# Patient Record
Sex: Male | Born: 1975 | Race: White | Hispanic: No | Marital: Married | Smoking: Former smoker
Health system: Southern US, Community
[De-identification: ages and names within clinical notes are randomized; demographics above are authoritative.]

## PROBLEM LIST (undated history)

## (undated) HISTORY — PX: INNER EAR SURGERY: SHX679

---

## 2010-01-20 ENCOUNTER — Emergency Department (HOSPITAL_BASED_OUTPATIENT_CLINIC_OR_DEPARTMENT_OTHER): Admission: EM | Admit: 2010-01-20 | Discharge: 2010-01-21 | Payer: Self-pay | Admitting: Emergency Medicine

## 2010-01-20 ENCOUNTER — Ambulatory Visit: Payer: Self-pay | Admitting: Diagnostic Radiology

## 2010-08-23 ENCOUNTER — Emergency Department (HOSPITAL_BASED_OUTPATIENT_CLINIC_OR_DEPARTMENT_OTHER): Admission: EM | Admit: 2010-08-23 | Discharge: 2010-08-23 | Payer: Self-pay | Admitting: Emergency Medicine

## 2011-11-25 ENCOUNTER — Emergency Department (HOSPITAL_BASED_OUTPATIENT_CLINIC_OR_DEPARTMENT_OTHER)
Admission: EM | Admit: 2011-11-25 | Discharge: 2011-11-25 | Disposition: A | Discharge status: Home / Self Care | Payer: Self-pay | Attending: Emergency Medicine | Admitting: Emergency Medicine

## 2011-11-25 ENCOUNTER — Other Ambulatory Visit: Payer: Self-pay

## 2011-11-25 ENCOUNTER — Encounter (HOSPITAL_BASED_OUTPATIENT_CLINIC_OR_DEPARTMENT_OTHER): Payer: Self-pay | Admitting: Emergency Medicine

## 2011-11-25 DIAGNOSIS — R61 Generalized hyperhidrosis: Secondary | ICD-10-CM | POA: Insufficient documentation

## 2011-11-25 DIAGNOSIS — R112 Nausea with vomiting, unspecified: Secondary | ICD-10-CM | POA: Insufficient documentation

## 2011-11-25 DIAGNOSIS — R109 Unspecified abdominal pain: Secondary | ICD-10-CM | POA: Insufficient documentation

## 2011-11-25 DIAGNOSIS — K219 Gastro-esophageal reflux disease without esophagitis: Secondary | ICD-10-CM | POA: Insufficient documentation

## 2011-11-25 LAB — CBC
HCT: 43.8 % (ref 39.0–52.0)
Hemoglobin: 15.1 g/dL (ref 13.0–17.0)
MCH: 30.1 pg (ref 26.0–34.0)
MCV: 87.4 fL (ref 78.0–100.0)
Platelets: 328 10*3/uL (ref 150–400)
RBC: 5.01 MIL/uL (ref 4.22–5.81)
RDW: 12.8 % (ref 11.5–15.5)
WBC: 13.8 10*3/uL — ABNORMAL HIGH (ref 4.0–10.5)

## 2011-11-25 LAB — LIPASE, BLOOD: Lipase: 21 U/L (ref 11–59)

## 2011-11-25 LAB — COMPREHENSIVE METABOLIC PANEL
ALT: 19 U/L (ref 0–53)
AST: 15 U/L (ref 0–37)
Alkaline Phosphatase: 49 U/L (ref 39–117)
BUN: 11 mg/dL (ref 6–23)
Chloride: 100 mEq/L (ref 96–112)
GFR calc non Af Amer: >90 mL/min (ref >90)
Sodium: 142 mEq/L (ref 135–145)
Total Bilirubin: 0.4 mg/dL (ref 0.3–1.2)
Total Protein: 7.5 g/dL (ref 6.0–8.3)

## 2011-11-25 LAB — DIFFERENTIAL
Eosinophils Relative: 1 % (ref 0–5)
Lymphs Abs: 2.8 10*3/uL (ref 0.7–4.0)

## 2011-11-25 MED ORDER — FAMOTIDINE IN NACL 20-0.9 MG/50ML-% IV SOLN
20.0000 mg | Freq: Once | INTRAVENOUS | Status: AC
  Administered 2011-11-25: 20 mg via INTRAVENOUS
  Filled 2011-11-25: qty 50

## 2011-11-25 MED ORDER — SODIUM CHLORIDE 0.9 % IV BOLUS (SEPSIS)
1000.0000 mL | Freq: Once | INTRAVENOUS | Status: AC
  Administered 2011-11-25: 1000 mL via INTRAVENOUS

## 2011-11-25 MED ORDER — GI COCKTAIL ~~LOC~~
30.0000 mL | Freq: Once | ORAL | Status: AC
  Administered 2011-11-25: 30 mL via ORAL
  Filled 2011-11-25: qty 30
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
## 2011-11-25 NOTE — Discharge Instructions (Signed)
As we discussed, it is very important that you begin taking your omeprazole again.  He may take any of the over-the-counter equivalents, such as lansoprazole, or pantoprazole.  Take this medication as directed for the next 2 weeks.  You should also purchase Maalox to have for acute exacerbations.  Additionally, please minimize known precipitants of your condition.  If you develop any new, or concerning changes in her condition, such as difficulty breathing, chest pain, consciousness, different abdominal pain, please return to emergency department for further evaluation.  Please use the provided number when possible, to arrange gastroenterology followup.

## 2011-11-25 NOTE — ED Notes (Signed)
Pt c/o vomiting since this am; feels like heartburn now in epigastric area; has been able to tolerate po liquids

## 2014-05-19 ENCOUNTER — Encounter (HOSPITAL_BASED_OUTPATIENT_CLINIC_OR_DEPARTMENT_OTHER): Payer: Self-pay | Admitting: Emergency Medicine

## 2014-05-19 ENCOUNTER — Emergency Department (HOSPITAL_BASED_OUTPATIENT_CLINIC_OR_DEPARTMENT_OTHER)
Admission: EM | Admit: 2014-05-19 | Discharge: 2014-05-19 | Disposition: A | Discharge status: Home / Self Care | Payer: Medicaid Other | Attending: Emergency Medicine | Admitting: Emergency Medicine

## 2014-05-19 DIAGNOSIS — T63461A Toxic effect of venom of wasps, accidental (unintentional), initial encounter: Secondary | ICD-10-CM | POA: Insufficient documentation

## 2014-05-19 DIAGNOSIS — Z79899 Other long term (current) drug therapy: Secondary | ICD-10-CM | POA: Insufficient documentation

## 2014-05-19 DIAGNOSIS — T63441A Toxic effect of venom of bees, accidental (unintentional), initial encounter: Secondary | ICD-10-CM

## 2014-05-19 DIAGNOSIS — Y929 Unspecified place or not applicable: Secondary | ICD-10-CM | POA: Insufficient documentation

## 2014-05-19 DIAGNOSIS — Z87891 Personal history of nicotine dependence: Secondary | ICD-10-CM | POA: Insufficient documentation

## 2014-05-19 DIAGNOSIS — T6391XA Toxic effect of contact with unspecified venomous animal, accidental (unintentional), initial encounter: Secondary | ICD-10-CM | POA: Insufficient documentation

## 2014-05-19 DIAGNOSIS — Y939 Activity, unspecified: Secondary | ICD-10-CM | POA: Insufficient documentation

## 2014-05-19 MED ORDER — IBUPROFEN 400 MG PO TABS
600.0000 mg | ORAL_TABLET | Freq: Once | ORAL | Status: AC
  Administered 2014-05-19: 600 mg via ORAL
  Filled 2014-05-19 (×2): qty 1

## 2014-05-19 NOTE — Discharge Instructions (Signed)

## 2014-05-19 NOTE — ED Notes (Signed)
Pt given ibuprofen per request prior to leaving.

## 2014-05-19 NOTE — ED Notes (Signed)
Bee sting to left ear PTA.

## 2014-05-19 NOTE — ED Notes (Signed)
Pt asking for ibuprofen for pain prior to leaving.

## 2014-05-28 NOTE — ED Provider Notes (Signed)
CSN: [id number]     Arrival date & time 05/19/14  1135 History   First MD Initiated Contact with Patient 05/19/14 1201     Chief Complaint  Patient presents with  . Insect Bite   HPI Mr. Rabine is a 38 yo man who experienced 3 bee stings while in his car about 5 minutes prior to presentation to the ED. One is on his right lower extremity, one is on his right back and one is on his outer left ear. He states that he has been stung by bees in the past without any problems, but was concerned initially today because one of the stings was on his ear; he was worried that this area might be more tender than the sites of other bee stings.   History reviewed. No pertinent past medical history. Past Surgical History  Procedure Laterality Date  . Inner ear surgery     No family history on file. History  Substance Use Topics  . Smoking status: Former Games developer  . Smokeless tobacco: Not on file  . Alcohol Use: No    Review of Systems  All other systems reviewed and are negative.  General: has been well, no recent illness Skin: no rashes or lesions other than those mentioned in HPI HEENT: no headaches, no changes in vision or hearing, bee sting on left outer ear Cardiac: no chest pain, no palpitations Respiratory: no shortness of breath, no wheezes GI: no abdominal pain, no changes in BMs Urinary: no changes in urination Msk: no muscle pain or joint tenderness Psychiatric: no history of depression or anxiety   Allergies  Review of patient's allergies indicates no known allergies.  Home Medications   Prior to Admission medications   Medication Sig Start Date End Date Taking? Authorizing Provider  omeprazole (PRILOSEC) 20 MG capsule Take 20 mg by mouth daily.   Yes Historical Provider, MD  acetaminophen (TYLENOL) 500 MG tablet Take 1,000 mg by mouth every 6 (six) hours as needed. For pain    Historical Provider, MD  CHLOROPHYLL PO Take 30 mLs by mouth daily.    Historical Provider, MD   BP  142/96  Pulse 80  Temp(Src) 97.9 F (36.6 C) (Oral)  Ht  (1.88 m)  Wt 213 lb (96.616 kg)  BMI 27.34 kg/m2  SpO2 99% Physical Exam Appearance: in NAD, apologizing for coming to the ED HEENT: small area of erythema at inner fold of outer left ear; no stinger present, no swelling, otherwise AT/[place], PERRL, EOMi, hearing intact and symmetric, tongue of normal size Heart: RRR, normal S1S2 Lungs: normal effort, CTAB, no wheezing Abdomen: thin, +BS, soft, nontender Musculoskeletal: no tenderness to palpation Extremities: 2" diameter round raised area of erythema and tenderness around presumed bee sting on right lower back, 1" diameter round raised area of erythema and tenderness around presumed bee sting on right lower extremity, above knee Neurologic: A&Ox3   ED Course  Procedures (including critical care time) Labs Review Labs Reviewed - No data to display  Imaging Review No results found.   EKG Interpretation None      MDM   Final diagnoses:  Bee sting, accidental or unintentional, initial encounter    Mr. Isa is a 38 yo man who had 3 bee stings while dirivng his car just prior to arrival and was concerned about one sting that was on his outer ear; he had no impressive swelling, worrisome erythema, shortness of breath or any other signs of anaphylaxis, and was discharged home  with advice to manage his pain with NSAIDs.      Dionne Ano, MD 05/28/14 [phone]

## 2014-06-02 NOTE — ED Provider Notes (Signed)
I saw and evaluated the patient, reviewed the resident's note and I agree with the findings and plan.   .Face to face Exam:  General:  Awake HEENT:  Atraumatic Resp:  Normal effort Abd:  Nondistended Neuro:No focal weakness  Nelia Shi, MD 06/02/14 2214

## 2014-12-02 ENCOUNTER — Emergency Department (HOSPITAL_BASED_OUTPATIENT_CLINIC_OR_DEPARTMENT_OTHER)
Admission: EM | Admit: 2014-12-02 | Discharge: 2014-12-02 | Disposition: A | Discharge status: Home / Self Care | Payer: Medicaid Other | Attending: Emergency Medicine | Admitting: Emergency Medicine

## 2014-12-02 ENCOUNTER — Emergency Department (HOSPITAL_BASED_OUTPATIENT_CLINIC_OR_DEPARTMENT_OTHER): Payer: Medicaid Other

## 2014-12-02 ENCOUNTER — Encounter (HOSPITAL_BASED_OUTPATIENT_CLINIC_OR_DEPARTMENT_OTHER): Payer: Self-pay | Admitting: *Deleted

## 2014-12-02 DIAGNOSIS — Z87891 Personal history of nicotine dependence: Secondary | ICD-10-CM | POA: Insufficient documentation

## 2014-12-02 DIAGNOSIS — Z79899 Other long term (current) drug therapy: Secondary | ICD-10-CM | POA: Diagnosis not present

## 2014-12-02 DIAGNOSIS — S61011A Laceration without foreign body of right thumb without damage to nail, initial encounter: Secondary | ICD-10-CM

## 2014-12-02 DIAGNOSIS — Y9389 Activity, other specified: Secondary | ICD-10-CM | POA: Insufficient documentation

## 2014-12-02 DIAGNOSIS — Y998 Other external cause status: Secondary | ICD-10-CM | POA: Diagnosis not present

## 2014-12-02 DIAGNOSIS — Y9289 Other specified places as the place of occurrence of the external cause: Secondary | ICD-10-CM | POA: Diagnosis not present

## 2014-12-02 DIAGNOSIS — W312XXA Contact with powered woodworking and forming machines, initial encounter: Secondary | ICD-10-CM | POA: Diagnosis not present

## 2014-12-02 DIAGNOSIS — S65401A Unspecified injury of blood vessel of right thumb, initial encounter: Secondary | ICD-10-CM | POA: Diagnosis present

## 2014-12-02 MED ORDER — OXYCODONE-ACETAMINOPHEN 5-325 MG PO TABS: 1.0000 | ORAL_TABLET | ORAL | Status: AC | PRN

## 2014-12-02 NOTE — ED Provider Notes (Signed)
CSN: [id number]     Arrival date & time 12/02/14  1926 History  This chart was scribed for Audree CamelScott T Remon Quinto, MD by Luisa DagoPriscilla Tutu, ED Scribe. This patient was seen in room MH10/MH10 and the patient's care was started at 9:21 PM.     Chief Complaint  Patient presents with  . Finger Injury   The history is provided by the patient and medical records. No language interpreter was used.   HPI Comments: Harry Miller is a 39 y.o. male who presents to the Emergency Department complaining of laceration to his right thumb that occurred approximately 2.5 hours ago. Pt states that he was using a table saw when it slipped and accidentally cut his right thumb. Bleeding is controlled. He reports some numbness to the tip of his right thumb. Pt's last tetanus vaccine was in 2012. No fever, or weakness.  History reviewed. No pertinent past medical history. Past Surgical History  Procedure Laterality Date  . Inner ear surgery     No family history on file. History  Substance Use Topics  . Smoking status: Former Games developermoker  . Smokeless tobacco: Not on file  . Alcohol Use: No    Review of Systems  Constitutional: Negative for fever.  Musculoskeletal: Positive for arthralgias. Negative for myalgias.  Skin: Positive for wound.  Neurological: Positive for numbness. Negative for weakness.  All other systems reviewed and are negative.  Allergies  Review of patient's allergies indicates no known allergies.  Home Medications   Prior to Admission medications   Medication Sig Start Date End Date Taking? Authorizing Provider  omeprazole (PRILOSEC) 20 MG capsule Take 20 mg by mouth daily.   Yes Historical Provider, MD  acetaminophen (TYLENOL) 500 MG tablet Take 1,000 mg by mouth every 6 (six) hours as needed. For pain    Historical Provider, MD  CHLOROPHYLL PO Take 30 mLs by mouth daily.    Historical Provider, MD   BP 151/96 mmHg  Pulse 99  Temp(Src) 98.5 F (36.9 C) (Oral)  Resp 18  Ht 6\' 2"  (1.88 m)   Wt 220 lb (99.791 kg)  BMI 28.23 kg/m2  SpO2 98%  Physical Exam  Constitutional: He is oriented to person, place, and time. He appears well-developed and well-nourished. No distress.  HENT:  Head: Normocephalic and atraumatic.  Cardiovascular: Intact distal pulses.   Pulses:      Radial pulses are 2+ on the right side.  Pulmonary/Chest: Effort normal. No respiratory distress.  Musculoskeletal:       Right hand: He exhibits tenderness and laceration. He exhibits normal capillary refill.  Neurological: He is alert and oriented to person, place, and time.  Skin: Skin is warm and dry. No erythema. No pallor.  Psychiatric: He has a normal mood and affect. His behavior is normal.  Nursing note and vitals reviewed.  Small chunk of skin avulsed just beside nail. Nail intact without tenderness, lac, or hematoma. Small puncture wound away from nail but otherwise there is a superficial skin injury without deep laceration. Focal numbness over the injury but otherwise neurologically intact.   ED Course  Procedures (including critical care time)  DIAGNOSTIC STUDIES: Oxygen Saturation is 98% on RA, normal by my interpretation.    COORDINATION OF CARE: 9:25 PM- Pt advised of plan for treatment and pt agrees.  Imaging Review Dg Finger Thumb Right  12/02/2014   CLINICAL DATA:  Recent laceration with severe hemorrhage  EXAM: RIGHT THUMB 2+V  COMPARISON:  None.  FINDINGS: Soft tissue injury  is noted consistent with the given clinical history. No acute bony abnormality is seen. No radiopaque foreign body is noted.  IMPRESSION: Soft tissue injury without acute bony abnormality.   Electronically Signed   By: Alcide Clever M.D.   On: 12/02/2014 20:03    MDM   Final diagnoses:  Thumb laceration, right, initial encounter    Patient is requesting no sutures given the wound is well approximated. He is missing a small bit of skin near the nail but this is not amenable to repair. No bleeding. Tdap up to  date. Pain controlled. Wound cleaned and dressed with xeroform gauze. Given the well approximated wound I do not feel it would be beneficial for any sutures, especially because this seems superficial, likely due to the table saw automatically stopping when it hit his skin. Will give pain control and f/u with hand.  I personally performed the services described in this documentation, which was scribed in my presence. The recorded information has been reviewed and is accurate.   Audree Camel, MD 12/03/14 [phone]

## 2014-12-02 NOTE — ED Notes (Signed)
Pt states he cut his right hand thumb finger with a table saw today.

## 2014-12-02 NOTE — ED Notes (Signed)
Dressing placed to right thumb with xeroform, gauze and coband.  Patient educated to change daily.  Patient voiced understanding.

## 2020-01-25 ENCOUNTER — Emergency Department (HOSPITAL_BASED_OUTPATIENT_CLINIC_OR_DEPARTMENT_OTHER): Payer: Medicaid Other

## 2020-01-25 ENCOUNTER — Emergency Department (HOSPITAL_BASED_OUTPATIENT_CLINIC_OR_DEPARTMENT_OTHER)
Admission: EM | Admit: 2020-01-25 | Discharge: 2020-01-25 | Disposition: A | Discharge status: Home / Self Care | Payer: Medicaid Other | Attending: Emergency Medicine | Admitting: Emergency Medicine

## 2020-01-25 ENCOUNTER — Other Ambulatory Visit: Payer: Self-pay

## 2020-01-25 ENCOUNTER — Encounter (HOSPITAL_BASED_OUTPATIENT_CLINIC_OR_DEPARTMENT_OTHER): Payer: Self-pay

## 2020-01-25 DIAGNOSIS — Z79899 Other long term (current) drug therapy: Secondary | ICD-10-CM | POA: Diagnosis not present

## 2020-01-25 DIAGNOSIS — R1114 Bilious vomiting: Secondary | ICD-10-CM | POA: Diagnosis not present

## 2020-01-25 DIAGNOSIS — R1011 Right upper quadrant pain: Secondary | ICD-10-CM | POA: Diagnosis present

## 2020-01-25 DIAGNOSIS — Z87891 Personal history of nicotine dependence: Secondary | ICD-10-CM | POA: Diagnosis not present

## 2020-01-25 DIAGNOSIS — R0789 Other chest pain: Secondary | ICD-10-CM | POA: Insufficient documentation

## 2020-01-25 LAB — CBC WITH DIFFERENTIAL/PLATELET
Abs Immature Granulocytes: 0.03 10*3/uL (ref 0.00–0.07)
Basophils Absolute: 0.1 10*3/uL (ref 0.0–0.1)
Basophils Relative: 1 %
Eosinophils Absolute: 0.3 10*3/uL (ref 0.0–0.5)
Eosinophils Relative: 3 %
HCT: 47.8 % (ref 39.0–52.0)
Hemoglobin: 16.3 g/dL (ref 13.0–17.0)
Immature Granulocytes: 0 %
Lymphocytes Relative: 25 %
Lymphs Abs: 2.2 10*3/uL (ref 0.7–4.0)
MCH: 31.9 pg (ref 26.0–34.0)
MCHC: 34.1 g/dL (ref 30.0–36.0)
MCV: 93.5 fL (ref 80.0–100.0)
Monocytes Absolute: 0.8 10*3/uL (ref 0.1–1.0)
Monocytes Relative: 9 %
Neutro Abs: 5.5 10*3/uL (ref 1.7–7.7)
Neutrophils Relative %: 62 %
Platelets: 303 10*3/uL (ref 150–400)
RBC: 5.11 MIL/uL (ref 4.22–5.81)
RDW: 11.9 % (ref 11.5–15.5)
WBC: 8.8 10*3/uL (ref 4.0–10.5)
nRBC: 0 % (ref 0.0–0.2)

## 2020-01-25 LAB — COMPREHENSIVE METABOLIC PANEL
ALT: 57 U/L — ABNORMAL HIGH (ref 0–44)
AST: 41 U/L (ref 15–41)
Albumin: 4.3 g/dL (ref 3.5–5.0)
Alkaline Phosphatase: 47 U/L (ref 38–126)
Anion gap: 11 (ref 5–15)
BUN: 11 mg/dL (ref 6–20)
CO2: 25 mmol/L (ref 22–32)
Calcium: 9.2 mg/dL (ref 8.9–10.3)
Chloride: 100 mmol/L (ref 98–111)
Creatinine, Ser: 0.72 mg/dL (ref 0.61–1.24)
GFR calc Af Amer: >60 mL/min (ref >60)
GFR calc non Af Amer: >60 mL/min (ref >60)
Glucose, Bld: 110 mg/dL — ABNORMAL HIGH (ref 70–99)
Potassium: 4.2 mmol/L (ref 3.5–5.1)
Sodium: 136 mmol/L (ref 135–145)
Total Bilirubin: 0.6 mg/dL (ref 0.3–1.2)
Total Protein: 7.5 g/dL (ref 6.5–8.1)

## 2020-01-25 LAB — URINALYSIS, ROUTINE W REFLEX MICROSCOPIC
Bilirubin Urine: NEGATIVE
Glucose, UA: NEGATIVE mg/dL
Hgb urine dipstick: NEGATIVE
Ketones, ur: 40 mg/dL — AB
Leukocytes,Ua: NEGATIVE
Nitrite: NEGATIVE
Protein, ur: NEGATIVE mg/dL
Specific Gravity, Urine: >1.03 — ABNORMAL HIGH (ref 1.005–1.030)
pH: 5.5 (ref 5.0–8.0)

## 2020-01-25 LAB — LIPASE, BLOOD: Lipase: 80 U/L — ABNORMAL HIGH (ref 11–51)

## 2020-01-25 LAB — TROPONIN I (HIGH SENSITIVITY): Troponin I (High Sensitivity): 2 ng/L (ref <18)

## 2020-01-25 MED ORDER — MORPHINE SULFATE (PF) 4 MG/ML IV SOLN
4.0000 mg | Freq: Once | INTRAVENOUS | Status: AC
  Administered 2020-01-25: 4 mg via INTRAVENOUS
  Filled 2020-01-25: qty 1

## 2020-01-25 MED ORDER — ONDANSETRON 4 MG PO TBDP
4.0000 mg | ORAL_TABLET | Freq: Three times a day (TID) | ORAL | 0 refills | Status: AC | PRN
Start: 2020-01-25 — End: ?

## 2020-01-25 MED ORDER — OXYCODONE HCL 5 MG PO TABS: 2.5000 mg | ORAL_TABLET | Freq: Four times a day (QID) | ORAL | 0 refills | Status: DC | PRN

## 2020-01-25 MED ORDER — IOHEXOL 300 MG/ML  SOLN
100.0000 mL | Freq: Once | INTRAMUSCULAR | Status: AC | PRN
  Administered 2020-01-25: 100 mL via INTRAVENOUS

## 2020-01-25 NOTE — Discharge Instructions (Addendum)
Clear liquid diet for the next 3-5 days. Begin advancing your diet slowly with very light, small meals that are low in fat. Avoid spicy, fried, or greasy foods. Avoid ALL alcohol.  Contact a health care provider if you: Do not recover as quickly as expected. Develop new or worsening symptoms. Have persistent pain, weakness, or nausea. Recover and then have another episode of pain. Have a fever. Get help right away if: You cannot eat or keep fluids down. Your pain becomes severe. Your skin or the white part of your eyes turns yellow (jaundice). You have sudden swelling in your abdomen. You vomit. You feel dizzy or you faint. Your blood sugar is high (over 300 mg/dL).

## 2020-01-25 NOTE — ED Provider Notes (Signed)
[facility] EMERGENCY DEPARTMENT Provider Note   CSN: [id number] Arrival date & time: 01/25/20  1035     History Chief Complaint  Patient presents with  . Abdominal Pain    Harry Miller is a 44 y.o. male with a past history of tobacco abuse who presents the emergency department with chief complaint of right upper quadrant and right chest wall pain.  It began 6 months ago after he fell onto a wood pile level.  He states that the pain is constant becomes an colicky waves of severity.  It sometimes radiates to his back.  He is unsure if anything makes it worse or better.  He has periods of severe colicky pain with associated diaphoresis and nausea without vomiting.  He denies shortness of breath, hemoptysis, history of CAD, hypertension or hyperlipidemia.  He denies a history of diabetes.  He has a distant history of tobacco abuse.  He denies unilateral leg swelling, recent confinement or surgeries, history of DVT or PE.  He has no previous history of surgeries to his abdomen.  Patient states that he was up all night last night and came in today but does not currently have any severe pain.  He was seen at another office for pain and was told that he had, per the patient, "something wrong with the dermatome."  HPI     History reviewed. No pertinent past medical history.  There are no problems to display for this patient.   Past Surgical History:  Procedure Laterality Date  . INNER EAR SURGERY         No family history on file.  Social History   Tobacco Use  . Smoking status: Former Games developer  . Smokeless tobacco: Never Used  Substance Use Topics  . Alcohol use: No  . Drug use: No    Home Medications Prior to Admission medications   Medication Sig Start Date End Date Taking? Authorizing Provider  acetaminophen (TYLENOL) 500 MG tablet Take 1,000 mg by mouth every 6 (six) hours as needed. For pain    [provider]  CHLOROPHYLL PO Take 30 mLs by mouth daily.     [provider]  omeprazole (PRILOSEC) 20 MG capsule Take 20 mg by mouth daily.    [provider]  ondansetron (ZOFRAN ODT) 4 MG disintegrating tablet Take 1 tablet (4 mg total) by mouth every 8 (eight) hours as needed for nausea or vomiting. 01/25/20   Arthor Captain, PA-C  oxyCODONE (ROXICODONE) 5 MG immediate release tablet Take 0.5-1 tablets (2.5-5 mg total) by mouth every 6 (six) hours as needed for severe pain. 01/25/20   Arthor Captain, PA-C  oxyCODONE-acetaminophen (PERCOCET) 5-325 MG per tablet Take 1-2 tablets by mouth every 4 (four) hours as needed for severe pain. 12/02/14   Pricilla Loveless, MD    Allergies    Patient has no known allergies.  Review of Systems   Review of Systems Ten systems reviewed and are negative for acute change, except as noted in the HPI.   Physical Exam Updated Vital Signs BP 126/88 (BP Location: Right Arm)   Pulse 82   Temp 98 F (36.7 C) (Oral)   Resp 16   Ht 6\' 2"  (1.88 m)   Wt 91.6 kg   SpO2 98%   BMI 25.94 kg/m   Physical Exam Vitals and nursing note reviewed.  Constitutional:      General: He is not in acute distress.    Appearance: He is well-developed. He is not  diaphoretic.  HENT:     Head: Normocephalic and atraumatic.  Eyes:     General: No scleral icterus.    Conjunctiva/sclera: Conjunctivae normal.  Cardiovascular:     Rate and Rhythm: Normal rate and regular rhythm.     Heart sounds: Normal heart sounds.  Pulmonary:     Effort: Pulmonary effort is normal. No respiratory distress.     Breath sounds: Normal breath sounds.  Chest:     Chest wall: No swelling, tenderness or crepitus.  Abdominal:     Palpations: Abdomen is soft.     Tenderness: There is abdominal tenderness in the right upper quadrant. There is no right CVA tenderness or left CVA tenderness.  Musculoskeletal:     Cervical back: Normal range of motion and neck supple.  Skin:    General: Skin is warm and dry.     Findings: No bruising  or rash.  Neurological:     Mental Status: He is alert.  Psychiatric:        Behavior: Behavior normal.     ED Results / Procedures / Treatments   Labs (all labs ordered are listed, but only abnormal results are displayed) Labs Reviewed  COMPREHENSIVE METABOLIC PANEL - Abnormal; Notable for the following components:      Result Value   Glucose, Bld 110 (*)    ALT 57 (*)    All other components within normal limits  LIPASE, BLOOD - Abnormal; Notable for the following components:   Lipase 80 (*)    All other components within normal limits  URINALYSIS, ROUTINE W REFLEX MICROSCOPIC - Abnormal; Notable for the following components:   Specific Gravity, Urine >1.030 (*)    Ketones, ur 40 (*)    All other components within normal limits  CBC WITH DIFFERENTIAL/PLATELET  TROPONIN I (HIGH SENSITIVITY)    EKG None  Radiology DG Ribs Unilateral W/Chest Right  Result Date: 01/25/2020 CLINICAL DATA:  Right upper quadrant pain EXAM: RIGHT RIBS AND CHEST - 3+ VIEW COMPARISON:  None. FINDINGS: No fracture or other bone lesions are seen involving the ribs. There is no evidence of pneumothorax or pleural effusion. Both lungs are clear. Heart size and mediastinal contours are within normal limits. IMPRESSION: Negative. Electronically Signed   By: Marnee SpringJonathon  Watts M.D.   On: 01/25/2020 11:48   CT ABDOMEN PELVIS W CONTRAST  Result Date: 01/25/2020 CLINICAL DATA:  Abdominal pain EXAM: CT ABDOMEN AND PELVIS WITH CONTRAST TECHNIQUE: Multidetector CT imaging of the abdomen and pelvis was performed using the standard protocol following bolus administration of intravenous contrast. CONTRAST:  100mL OMNIPAQUE IOHEXOL 300 MG/ML  SOLN COMPARISON:  Ultrasound right upper quadrant January 25, 2020 FINDINGS: Lower chest: Lung bases are clear. Hepatobiliary: There is diffuse hepatic steatosis. No focal liver lesions are evident. The gallbladder wall is not appreciably thickened. There is no biliary duct dilatation.  Pancreas: There is slight edema in the head of the pancreas with adjacent mesenteric soft tissue thickening and stranding anterior to the head of the pancreas. This inflammatory material abuts the proximal duodenum. Remainder of the pancreas appears unremarkable. No pancreatic mass or pseudocyst. No pancreatic duct dilatation. Spleen: No splenic lesions are evident. Adrenals/Urinary Tract: Adrenals bilaterally appear normal. Kidneys bilaterally show no evident mass or hydronephrosis on either side. There is no evident renal or ureteral calculus on either side. Urinary bladder is midline with wall thickness within normal limits. Stomach/Bowel: There is no appreciable bowel wall or mesenteric thickening. Note that the duodenum does not  show wall thickening in the area of pancreatitis which abuts the proximal duodenum. There is no evident bowel obstruction. The terminal ileum appears normal. There is no evident free air or portal venous air. Vascular/Lymphatic: No abdominal aortic aneurysm. There are occasional scattered foci of arterial vascular calcification of the aorta. Major venous structures appear patent. No evident adenopathy in the abdomen or pelvis. Reproductive: Prostate and seminal vesicles are normal in size and contour. No evident pelvic mass. Other: Appendix appears normal. No abscess or ascites evident in the abdomen or pelvis. Musculoskeletal: No blastic or lytic bone lesions. No intramuscular or abdominal wall lesions are evident. IMPRESSION: 1. Evidence of a degree of acute pancreatitis involving the head of the pancreas. Specifically, there is subtle edema in the head of the pancreas with soft tissue thickening and stranding anterior to the pancreas. This soft tissue stranding abuts the proximal duodenum but does not cause appreciable duodenal wall thickening in this area. 2.  Hepatic steatosis.  No focal liver lesions evident. 3. No bowel wall thickening or bowel obstruction. No abscess in the  abdomen or pelvis. Appendix appears normal. 4. No renal or ureteral calculus. No hydronephrosis. Urinary bladder wall thickness within normal limits. Electronically Signed   By: Lowella Grip III M.D.   On: 01/25/2020 13:41   US ABDOMEN LIMITED RUQ  Result Date: 01/25/2020 CLINICAL DATA:  44 year old male with right upper quadrant abdominal pain. EXAM: ULTRASOUND ABDOMEN LIMITED RIGHT UPPER QUADRANT COMPARISON:  None. FINDINGS: Gallbladder: Normal gallbladder wall thickness of 1 mm. The gallbladder lumen appears clear with no echogenic sludge or stones. No pericholecystic fluid. However, a positive sonographic Percell Miller sign is reported by the technologist. Common bile duct: Diameter: 3 mm, normal. Liver: Echogenic liver (image 18) with small areas of focal fatty sparing suspected near the gallbladder fossa (images 11 and 13). No intrahepatic biliary ductal dilatation or discrete liver lesion identified. Portal vein is patent on color Doppler imaging with normal direction of blood flow towards the liver. Other: Negative visible right kidney. IMPRESSION: 1. A positive sonographic Percell Miller sign is reported but there is no cholelithiasis or gallbladder wall thickening to corroborate acute cholecystitis. 2. Hepatic steatosis with patchy fatty sparing at the gallbladder fossa. Electronically Signed   By: Genevie Ann M.D.   On: 01/25/2020 11:47    Procedures Procedures (including critical care time)  Medications Ordered in ED Medications  morphine 4 MG/ML injection 4 mg (4 mg Intravenous Given 01/25/20 1330)  iohexol (OMNIPAQUE) 300 MG/ML solution 100 mL (100 mLs Intravenous Contrast Given 01/25/20 1318)    ED Course  I have reviewed the triage vital signs and the nursing notes.  Pertinent labs & imaging results that were available during my care of the patient were reviewed by me and considered in my medical decision making (see chart for details).  Clinical Course as of Jan 24 1730  Mon Jan 25, 2020    1257 ED ECG REPORT   Rate: 70  Rhythm: normal sinus rhythm  QRS Axis: normal  Intervals: normal  ST/T Wave abnormalities: normal  Conduction Disutrbances:none  Narrative Interpretation:   Old EKG Reviewed:  I have personally reviewed the EKG tracing and agree with the computerized printout as noted.    [AH]    Clinical Course User Index [AH] Margarita Mail, PA-C   MDM Rules/Calculators/A&P                       CC: Abdominal pain and right upper quadrant  pain VS: BP 126/88 (BP Location: Right Arm)   Pulse 82   Temp 98 F (36.7 C) (Oral)   Resp 16   Ht 6\' 2"  (1.88 m)   Wt 91.6 kg   SpO2 98%   BMI 25.94 kg/m   is gathered by patient and wife. Previous records obtained and reviewed. DDX:The patient's complaint of abdominal pain involves an extensive number of diagnostic and treatment options, and is a complaint that carries with it a high risk of complications, morbidity, and potential mortality. Given the large differential diagnosis, medical decision making is of high complexity. Differential diagnosis of epigastric pain includes: Functional or nonulcer dyspepsia , PUD, GERD, Gastritis, (NSAIDs, alcohol, stress, H. pylori, pernicious anemia), pancreatitis or pancreatic cancer, overeating indigestion (high-fat foods, coffee), drugs (aspirin, antibiotics (eg, macrolides, metronidazole), corticosteroids, digoxin, narcotics, theophylline), gastroparesis, lactose intolerance, malabsorption gastric cancer, parasitic infection, (Giardia, Strongyloides, Ascaris) cholelithiasis, choledocholithiasis, or cholangitis, ACS, pericarditis, pneumonia, abdominal hernia, pregnancy, intestinal ischemia, esophageal rupture, gastric volvulus, hepatitis.  Labs: I ordered reviewed and interpreted labs which include troponin which is negative, urinalysis which is also negative.  CBC without elevated white blood cell count or anemia.  CMP with mildly elevated blood glucose of insignificant  value.  Slightly elevated ALT.  Lipase is elevated at 80 Imaging: I ordered and reviewed images which included plain film x-ray of the ribs on the right and chest, ultrasound of the abdomen right upper quadrant, CT abdomen pelvis with contrast. I independently visualized and interpreted all imaging. Significant findings include peripancreatic fat stranding around the head of the pancreas suggestive of acute pancreatitis. There are no acute, significant findings on today's ultrasound or rib/chest x-ray images. EKG: EKG shows normal sinus rhythm at a rate of 74 Consults: N/A MDM: Patient here with pain which he places in the right upper quadrant lower rib cage that radiates to his back.  Work-up here today finds the patient has acute pancreatitis.  Patient denies "drinking very much."  However his wife pulled Dr. [id number] aside to state that he drinks and smokes a lot.  I have discussed all findings with the patient.  I believe he is appropriate for outpatient treatment of this mild pancreatitis.  He is tolerating p.o. fluids.  Patient advised to follow a clear liquid diet, avoid cigarette smoking and alcohol.  I then stressed the importance of avoidance of alcohol as this is likely the underlying cause however he should follow-up with his PCP for further testing including cholesterol and triglyceride levels.  Patient will be discharged with oxycodone and antinausea medications.PDMP reviewed during this encounter.  I discussed return precautions with the patient appears otherwise appropriate for discharge at this  Patient disposition: Discharge The patient appears reasonably screened and/or stabilized for discharge and I doubt any other medical condition or other [facility] requiring further screening, evaluation, or treatment in the ED at this time prior to discharge. I have discussed lab and/or imaging findings with the patient and answered all questions/concerns to the best of my ability.I have discussed return  precautions and OP follow up.     Final Clinical Impression(s) / ED Diagnoses Final diagnoses:  Bilious emesis    Rx / DC Orders ED Discharge Orders         Ordered    oxyCODONE (ROXICODONE) 5 MG immediate release tablet  Every 6 hours PRN     01/25/20 1437    ondansetron (ZOFRAN ODT) 4 MG disintegrating tablet  Every 8 hours PRN     01/25/20  1437           Arthor Captain, PA-C 01/25/20 1733    Sabas Sous, MD 01/27/20 [phone]

## 2020-01-25 NOTE — ED Triage Notes (Signed)
Pt having pain to RUQ, reports the pain is worse when lying, states the pain makes him double over in pain has been ongoing for 6 weeks. Last BM this morning, some constipation.

## 2020-01-25 NOTE — ED Notes (Signed)
ED Provider at bedside. 

## 2021-02-08 IMAGING — CT CT ABD-PELV W/ CM
2 of 5 series · 15 of 46 positions shown, 17 images · IV contrast (omnipaque)
Comparison: Ultrasound right upper quadrant January 25, 2020

CLINICAL DATA: Abdominal pain

EXAM:
CT ABDOMEN AND PELVIS WITH CONTRAST
TECHNIQUE: Multidetector CT imaging of the abdomen and pelvis was performed
using the standard protocol following bolus administration of
intravenous contrast.
CONTRAST:  100mL OMNIPAQUE IOHEXOL 300 MG/ML  SOLN

[Series 2: axial st · axial · 0.89mm/px · z∈[-544,-64]mm · 12 of 109 slices shown, 14 images]
[im 7/109  soft-tissue]
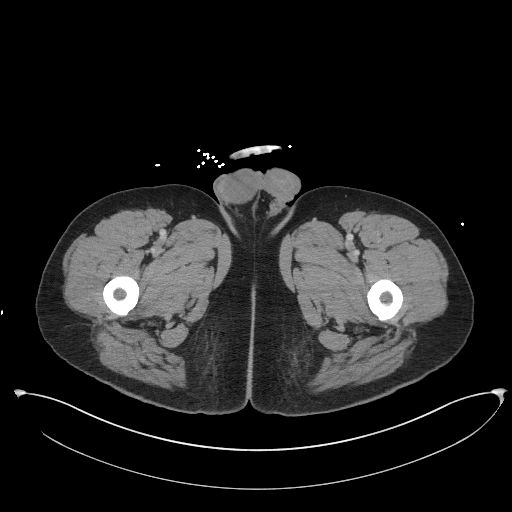
[im 7/109  bone]
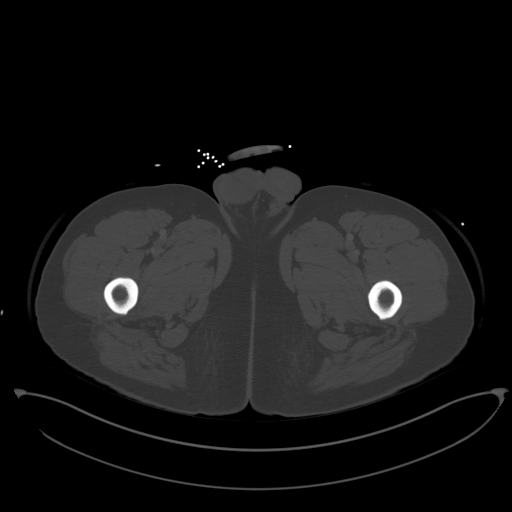
[im 19/109  soft-tissue]
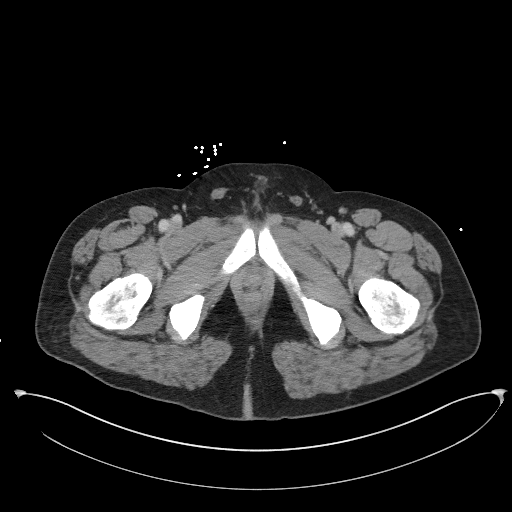
[im 25/109  soft-tissue]
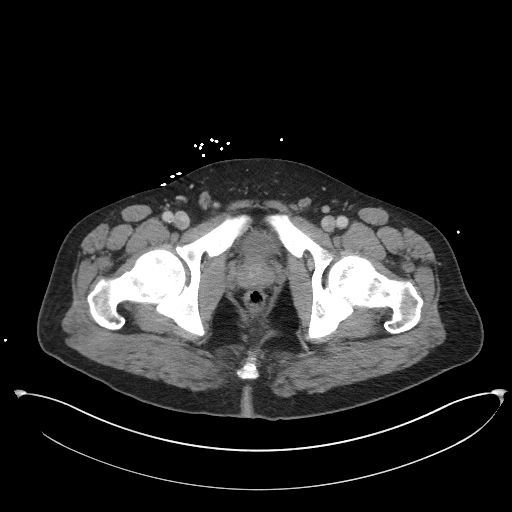
[im 31/109  soft-tissue]
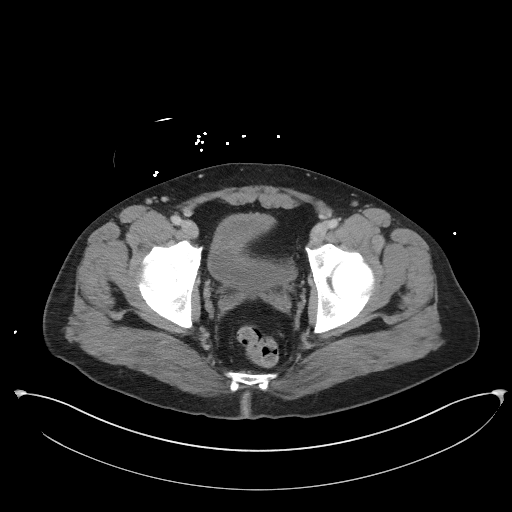
[im 43/109  soft-tissue]
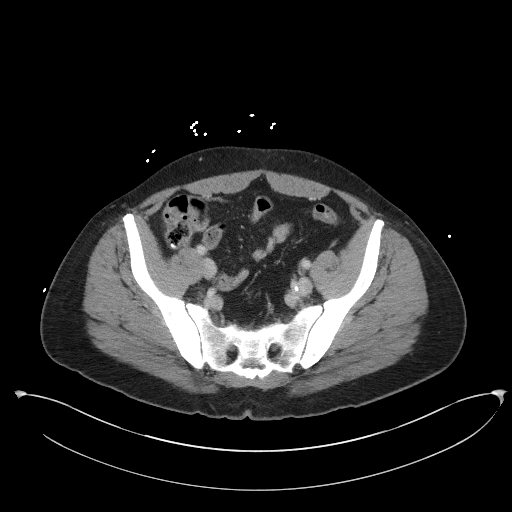
[im 49/109  soft-tissue]
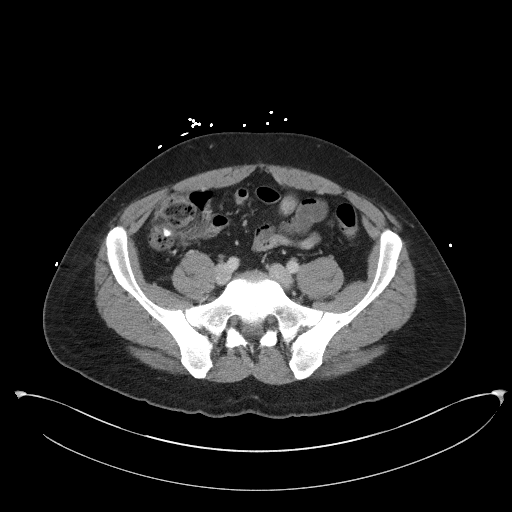
[im 61/109  soft-tissue]
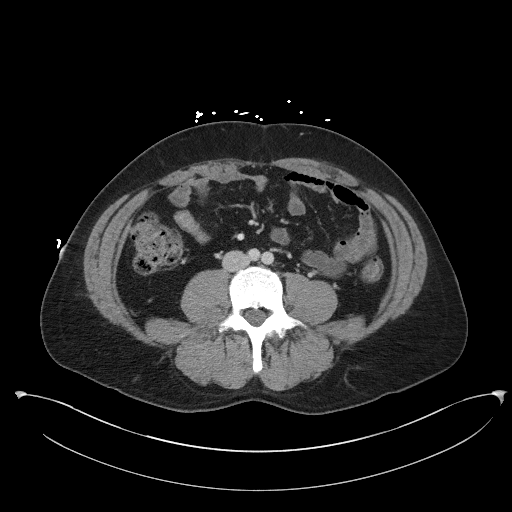
[im 67/109  soft-tissue]
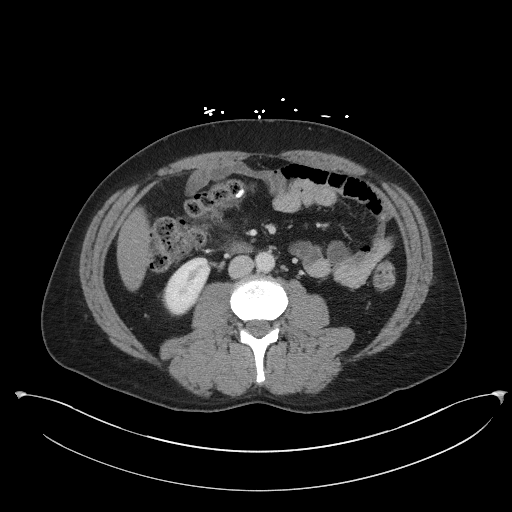
[im 79/109  soft-tissue]
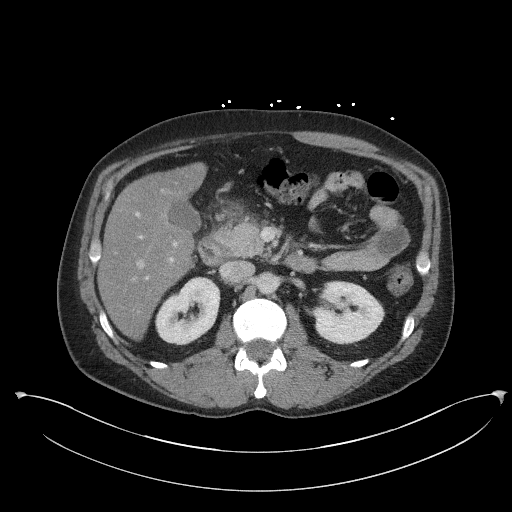
[im 79/109  bone]
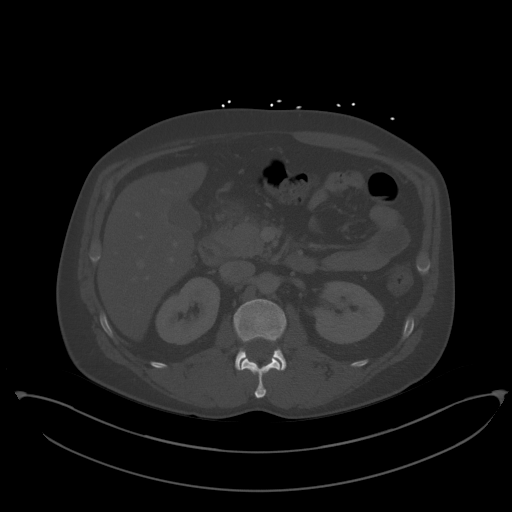
[im 85/109  soft-tissue]
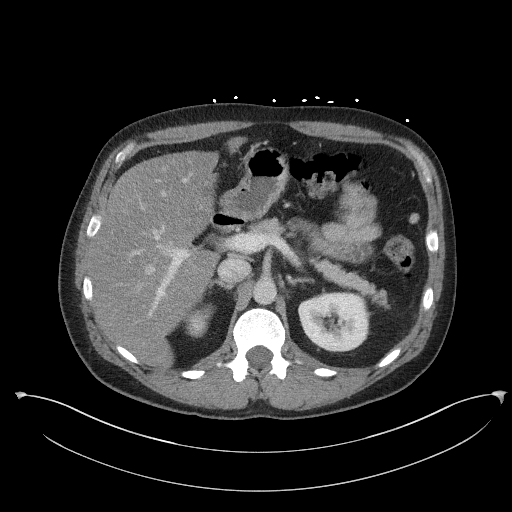
[im 91/109  soft-tissue]
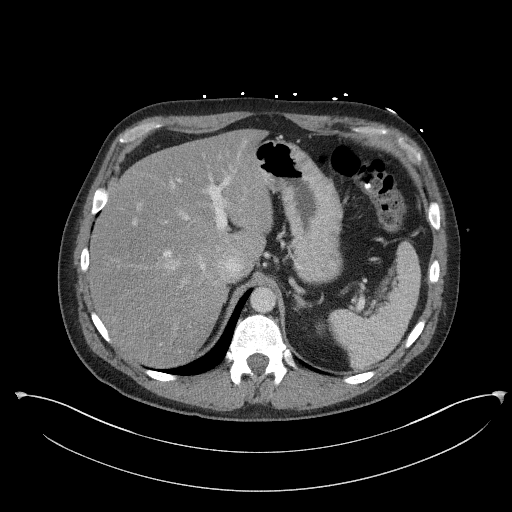
[im 103/109  soft-tissue]
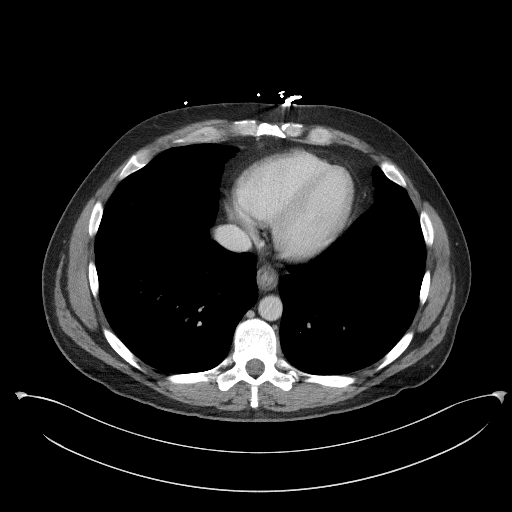

[Series 5: coronal st · coronal · 0.78mm/px · 3 of 97 slices shown]
[im 33/97  soft-tissue]
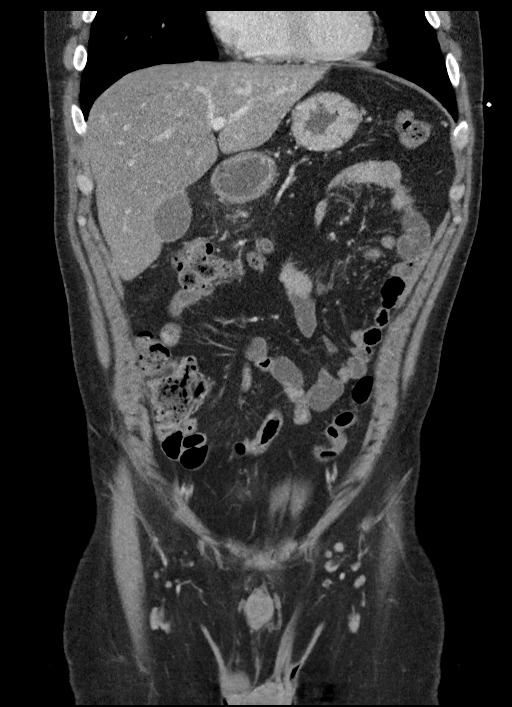
[im 43/97  soft-tissue]
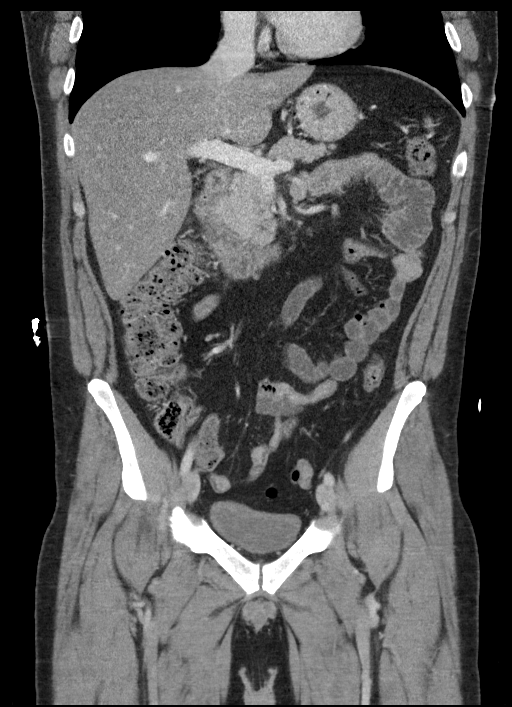
[im 54/97  soft-tissue]
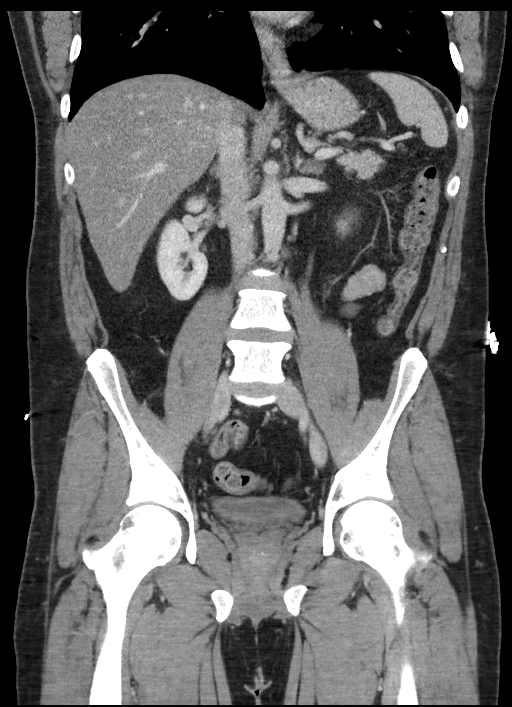

[15 of 46 positions shown; findings below may reference images not displayed]

FINDINGS: Lower chest: Lung bases are clear.

Hepatobiliary: There is diffuse hepatic steatosis. No focal liver
lesions are evident. The gallbladder wall is not appreciably
thickened. There is no biliary duct dilatation.

Pancreas: There is slight edema in the head of the pancreas with
adjacent mesenteric soft tissue thickening and stranding anterior to
the head of the pancreas. This inflammatory material abuts the
proximal duodenum. Remainder of the pancreas appears unremarkable.
No pancreatic mass or pseudocyst. No pancreatic duct dilatation.

Spleen: No splenic lesions are evident.

Adrenals/Urinary Tract: Adrenals bilaterally appear normal. Kidneys
bilaterally show no evident mass or hydronephrosis on either side.
There is no evident renal or ureteral calculus on either side.
Urinary bladder is midline with wall thickness within normal limits.

Stomach/Bowel: There is no appreciable bowel wall or mesenteric
thickening. Note that the duodenum does not show wall thickening in
the area of pancreatitis which abuts the proximal duodenum. There is
no evident bowel obstruction. The terminal ileum appears normal.
There is no evident free air or portal venous air.

Vascular/Lymphatic: No abdominal aortic aneurysm. There are
occasional scattered foci of arterial vascular calcification of the
aorta. Major venous structures appear patent. No evident adenopathy
in the abdomen or pelvis.

Reproductive: Prostate and seminal vesicles are normal in size and
contour. No evident pelvic mass.

Other: Appendix appears normal. No abscess or ascites evident in the
abdomen or pelvis.

Musculoskeletal: No blastic or lytic bone lesions. No intramuscular
or abdominal wall lesions are evident.
IMPRESSION: 1. Evidence of a degree of acute pancreatitis involving the head of
the pancreas. Specifically, there is subtle edema in the head of the
pancreas with soft tissue thickening and stranding anterior to the
pancreas. This soft tissue stranding abuts the proximal duodenum but
does not cause appreciable duodenal wall thickening in this area.

2.  Hepatic steatosis.  No focal liver lesions evident.

3. No bowel wall thickening or bowel obstruction. No abscess in the
abdomen or pelvis. Appendix appears normal.

4. No renal or ureteral calculus. No hydronephrosis. Urinary bladder
wall thickness within normal limits.

## 2021-02-08 IMAGING — DX DG RIBS W/ CHEST 3+V*R*
3 series · 3 of 3 positions shown · non-contrast
Comparison: None.

CLINICAL DATA: Right upper quadrant pain

EXAM:
RIGHT RIBS AND CHEST - 3+ VIEW

[chest pa]
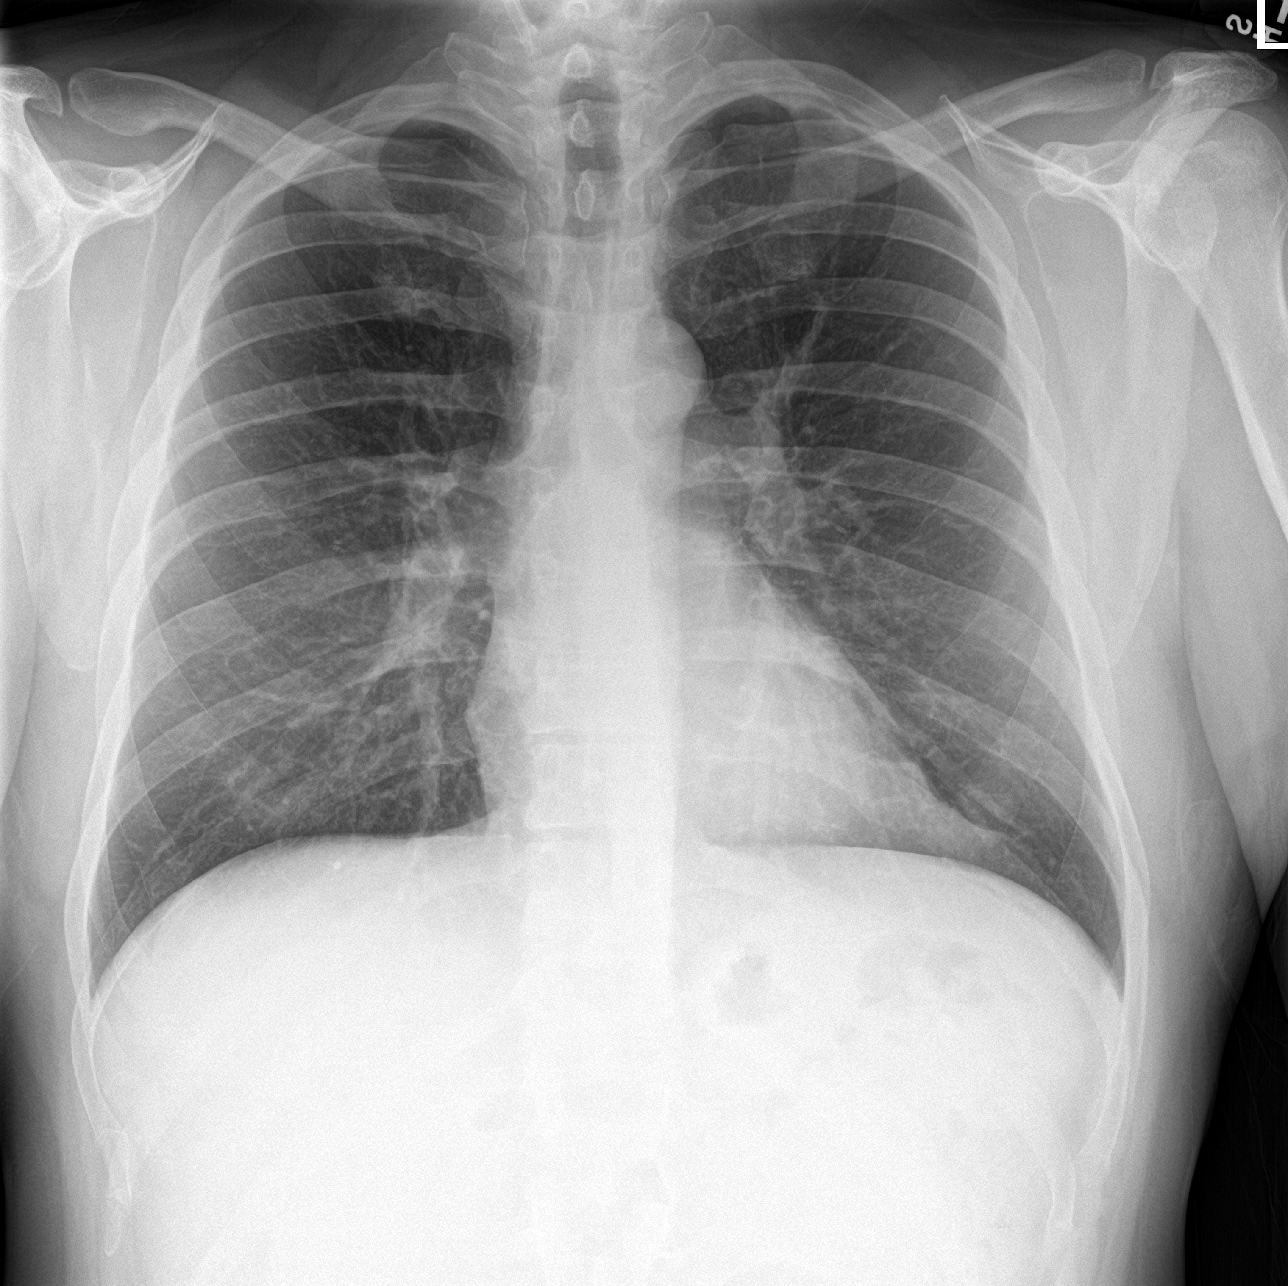

[rib pa]
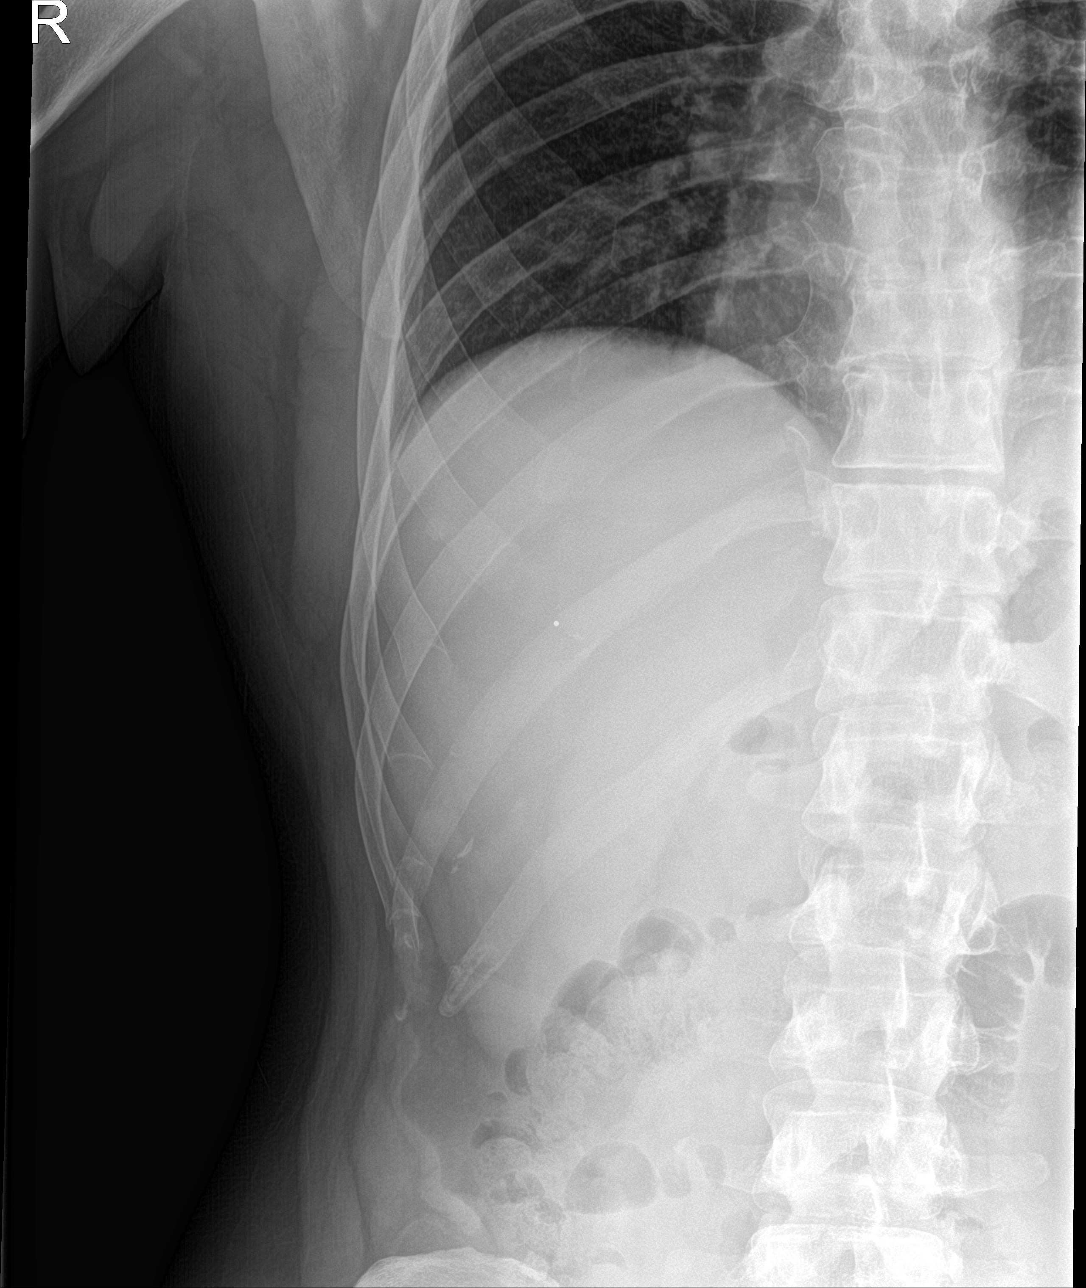

[rib pa obl]
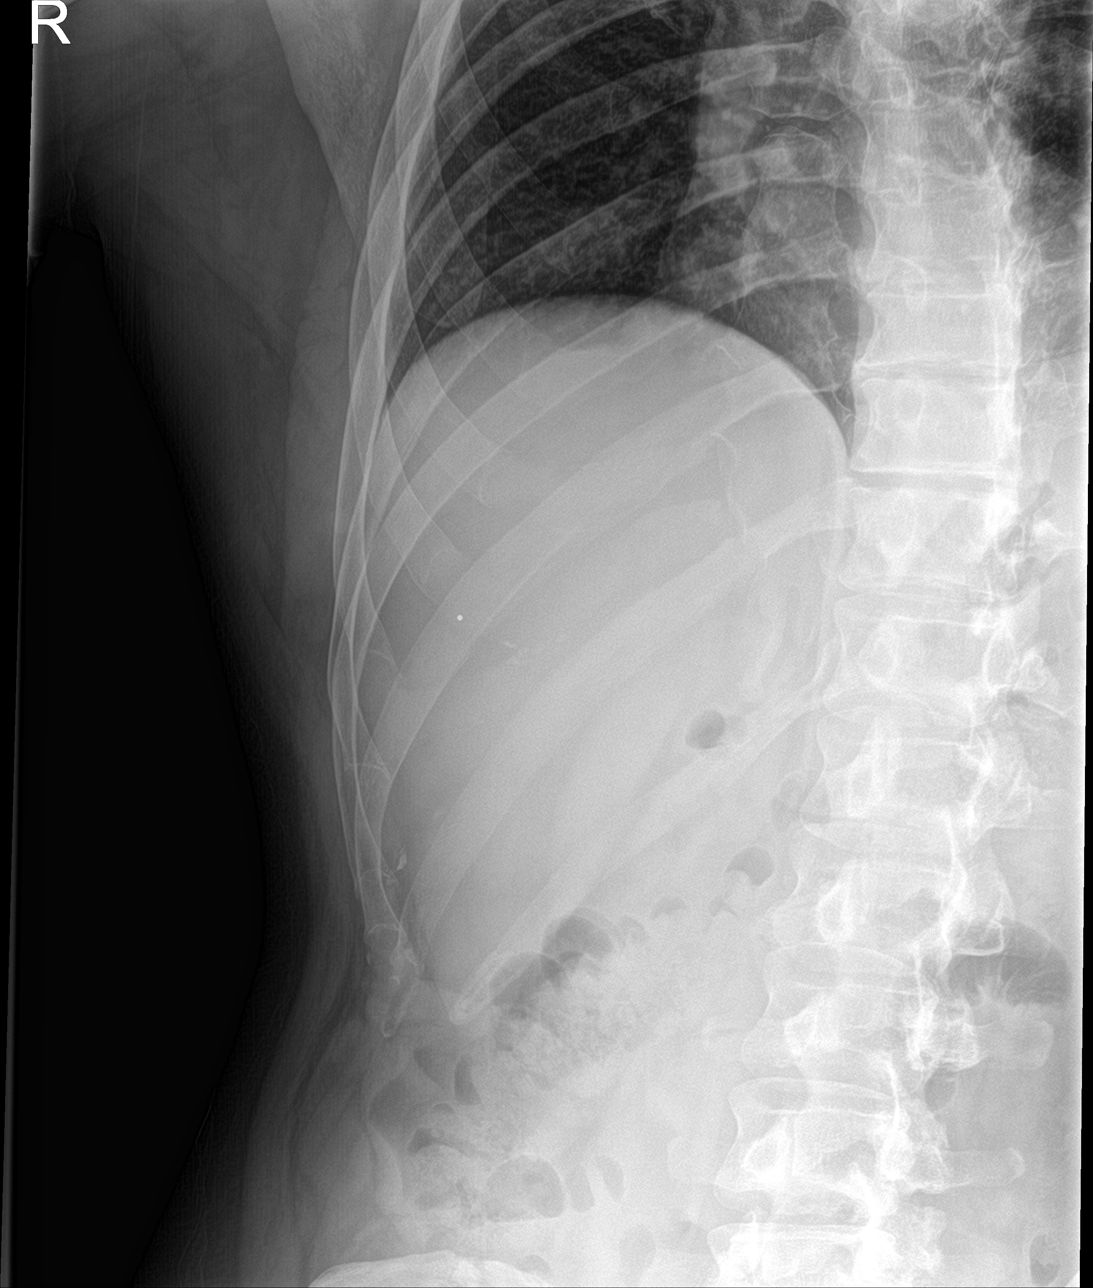

[3 of 3 positions shown; findings below may reference images not displayed]

FINDINGS: No fracture or other bone lesions are seen involving the ribs. There
is no evidence of pneumothorax or pleural effusion. Both lungs are
clear. Heart size and mediastinal contours are within normal limits.
IMPRESSION: Negative.

## 2023-03-05 ENCOUNTER — Emergency Department (HOSPITAL_BASED_OUTPATIENT_CLINIC_OR_DEPARTMENT_OTHER)
Admission: EM | Admit: 2023-03-05 | Discharge: 2023-03-05 | Disposition: A | Discharge status: Home / Self Care | Payer: Self-pay | Attending: Emergency Medicine | Admitting: Emergency Medicine

## 2023-03-05 ENCOUNTER — Emergency Department (HOSPITAL_BASED_OUTPATIENT_CLINIC_OR_DEPARTMENT_OTHER): Payer: Medicaid Other

## 2023-03-05 ENCOUNTER — Encounter (HOSPITAL_BASED_OUTPATIENT_CLINIC_OR_DEPARTMENT_OTHER): Payer: Self-pay

## 2023-03-05 DIAGNOSIS — S20212A Contusion of left front wall of thorax, initial encounter: Secondary | ICD-10-CM | POA: Insufficient documentation

## 2023-03-05 DIAGNOSIS — W19XXXA Unspecified fall, initial encounter: Secondary | ICD-10-CM

## 2023-03-05 DIAGNOSIS — Y92828 Other wilderness area as the place of occurrence of the external cause: Secondary | ICD-10-CM | POA: Insufficient documentation

## 2023-03-05 DIAGNOSIS — W01198A Fall on same level from slipping, tripping and stumbling with subsequent striking against other object, initial encounter: Secondary | ICD-10-CM | POA: Insufficient documentation

## 2023-03-05 MED ORDER — IBUPROFEN 800 MG PO TABS: 800.0000 mg | ORAL_TABLET | Freq: Once | ORAL | Status: DC

## 2023-03-05 MED ORDER — LIDOCAINE 5 % EX PTCH: 1.0000 | MEDICATED_PATCH | CUTANEOUS | Status: DC

## 2023-03-05 MED ORDER — OXYCODONE HCL 5 MG PO TABS: 5.0000 mg | ORAL_TABLET | ORAL | 0 refills | Status: AC | PRN

## 2023-03-05 NOTE — ED Notes (Signed)
Pt was ready to leave and did not take meds

## 2023-03-05 NOTE — ED Provider Notes (Signed)
[place] [facility] Provider Note   CSN: [id number] Arrival date & time: 03/05/23  0734     History  Chief Complaint  Patient presents with   Rib Injury    Harry Miller is a 47 y.o. male.  47 year old male who presents emergency department with left chest wall pain after a fall.  [place] when he slipped on a rock and fell striking his left ribs.  No head strike or LOC.  Not on blood thinners.  No shortness of breath.  Says that he has pain with deep breathing on his left chest wall.  No other injuries as result of the fall.       Home Medications Prior to Admission medications   Medication Sig Start Date End Date Taking? Authorizing Provider  oxyCODONE (ROXICODONE) 5 MG immediate release tablet Take 1 tablet (5 mg total) by mouth every 4 (four) hours as needed for severe pain. 03/05/23  Yes Rondel Baton, MD  acetaminophen (TYLENOL) 500 MG tablet Take 1,000 mg by mouth every 6 (six) hours as needed. For pain    [provider]  CHLOROPHYLL PO Take 30 mLs by mouth daily.    [provider]  omeprazole (PRILOSEC) 20 MG capsule Take 20 mg by mouth daily.    [provider]  ondansetron (ZOFRAN ODT) 4 MG disintegrating tablet Take 1 tablet (4 mg total) by mouth every 8 (eight) hours as needed for nausea or vomiting. 01/25/20   Arthor Captain, PA-C  oxyCODONE-acetaminophen (PERCOCET) 5-325 MG per tablet Take 1-2 tablets by mouth every 4 (four) hours as needed for severe pain. 12/02/14   Pricilla Loveless, MD      Allergies    Patient has no known allergies.    Review of Systems   Review of Systems  Physical Exam Updated Vital Signs BP (!) 144/109 (BP Location: Left Arm)   Pulse 69   Temp 98.4 F (36.9 C) (Oral)   Resp 18   Ht 6\' 2"  (1.88 m)   Wt 90.7 kg   SpO2 99%   BMI 25.68 kg/m  Physical Exam Vitals and nursing note reviewed.  Constitutional:      General:  He is not in acute distress.    Appearance: He is well-developed.  HENT:     Head: Normocephalic and atraumatic.     Right Ear: External ear normal.     Left Ear: External ear normal.     Nose: Nose normal.  Eyes:     Extraocular Movements: Extraocular movements intact.     Conjunctiva/sclera: Conjunctivae normal.     Pupils: Pupils are equal, round, and reactive to light.  Cardiovascular:     Rate and Rhythm: Normal rate and regular rhythm.     Heart sounds: Normal heart sounds.     Comments: Chest wall pain on approximately left T9 rib at the anterior axillary line Pulmonary:     Effort: Pulmonary effort is normal. No respiratory distress.     Breath sounds: Normal breath sounds.  Musculoskeletal:        General: No deformity.     Cervical back: Normal range of motion and neck supple.  Skin:    General: Skin is warm and dry.  Neurological:     Mental Status: He is alert. Mental status is at baseline.  Psychiatric:        Mood and Affect: Mood normal.  Behavior: Behavior normal.     ED Results / Procedures / Treatments   Labs (all labs ordered are listed, but only abnormal results are displayed) Labs Reviewed - No data to display  EKG None  Radiology DG Ribs Unilateral W/Chest Left  Result Date: 03/05/2023 CLINICAL DATA:  Fall, rib pain. EXAM: LEFT RIBS AND CHEST - 3+ VIEW COMPARISON:  None Available. FINDINGS: Three views of the chest and left ribs. No fracture or other bone lesions are seen involving the ribs. There is no evidence of pneumothorax or pleural effusion. Both lungs are clear. Heart size and mediastinal contours are within normal limits. IMPRESSION: Negative chest and left rib radiographs. Electronically Signed   By: Orvan Falconer M.D.   On: 03/05/2023 08:07    Procedures Procedures    Medications Ordered in ED Medications  lidocaine (LIDODERM) 5 % 1 patch (1 patch Transdermal Not Given 03/05/23 0902)  ibuprofen (ADVIL) tablet 800 mg (has no  administration in time range)    ED Course/ Medical Decision Making/ A&P                             Medical Decision Making Amount and/or Complexity of Data Reviewed Radiology: ordered.  Risk Prescription drug management.   Harry Miller is a 47 y.o. male who presents to the emergency department with left rib pain after a fall  Initial Ddx:  Rib fracture, bruised rib, pneumothorax  MDM:  Feel the patient likely has a bruised rib based on his history and physical.  Will obtain x-rays to evaluate for rib fracture.  Low concern for pneumothorax at this time but will obtain a chest x-ray to rule out.  Plan:  Rib series Lidocaine patch Ibuprofen  ED Summary/Re-evaluation:  Patient x-ray did not show any evidence of fracture.  Likely has a bruised rib based on his imaging results.  Will have him go home with Tylenol, ibuprofen, and lidocaine patches.  To give him oxycodone for any breakthrough pain.  Will him follow-up with his primary doctor in several days regarding his symptoms.  This patient presents to the ED for concern of complaints listed in HPI, this involves an extensive number of treatment options, and is a complaint that carries with it a high risk of complications and morbidity. Disposition including potential need for admission considered.   Dispo: DC Home. Return precautions discussed including, but not limited to, those listed in the AVS. Allowed pt time to ask questions which were answered fully prior to dc.  Records reviewed Outpatient Clinic Notes I independently reviewed the following imaging with scope of interpretation limited to determining acute life threatening conditions related to emergency care: Chest x-ray and agree with the radiologist interpretation with the following exceptions: none I have reviewed the patients home medications and made adjustments as needed        Final Clinical Impression(s) / ED Diagnoses Final diagnoses:  Contusion of rib  on left side, initial encounter  Fall, initial encounter    Rx / DC Orders ED Discharge Orders          Ordered    oxyCODONE (ROXICODONE) 5 MG immediate release tablet  Every 4 hours PRN        03/05/23 0853              Rondel Baton, MD 03/05/23 [phone]

## 2023-03-05 NOTE — Discharge Instructions (Signed)
You were seen for your rib pain in the emergency department.  It is likely that you have a bruised rib.  At home, please take ibuprofen, Tylenol, and use lidocaine patches we have prescribed you for your pain.    Check your MyChart online for the results of any tests that had not resulted by the time you left the emergency department.   Follow-up with your primary doctor in 2-3 days regarding your visit.    Return immediately to the emergency department if you experience any of the following: Difficulty breathing, fever, severe pain, or any other concerning symptoms.    Thank you for visiting our Emergency Department. It was a pleasure taking care of you today.

## 2023-03-05 NOTE — ED Triage Notes (Signed)
[place]. States pain has continued. Denies shortness of breath.
